# Patient Record
Sex: Female | Born: 1995 | Race: White | Hispanic: No | Marital: Single | Smoking: Never smoker
Health system: Southern US, Community
[De-identification: ages and names within clinical notes are randomized; demographics above are authoritative.]

## PROBLEM LIST (undated history)

## (undated) HISTORY — PX: OTHER SURGICAL HISTORY: SHX169

---
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
## 2016-02-05 ENCOUNTER — Emergency Department: Payer: BLUE CROSS/BLUE SHIELD

## 2016-02-05 ENCOUNTER — Encounter: Payer: Self-pay | Admitting: Emergency Medicine

## 2016-02-05 ENCOUNTER — Emergency Department
Admission: EM | Admit: 2016-02-05 | Discharge: 2016-02-05 | Disposition: A | Discharge status: Home / Self Care | Payer: BLUE CROSS/BLUE SHIELD | Attending: Emergency Medicine | Admitting: Emergency Medicine

## 2016-02-05 DIAGNOSIS — Y92328 Other athletic field as the place of occurrence of the external cause: Secondary | ICD-10-CM | POA: Insufficient documentation

## 2016-02-05 DIAGNOSIS — W228XXA Striking against or struck by other objects, initial encounter: Secondary | ICD-10-CM | POA: Insufficient documentation

## 2016-02-05 DIAGNOSIS — Y998 Other external cause status: Secondary | ICD-10-CM | POA: Insufficient documentation

## 2016-02-05 DIAGNOSIS — Y9363 Activity, rugby: Secondary | ICD-10-CM | POA: Insufficient documentation

## 2016-02-05 DIAGNOSIS — S060X0A Concussion without loss of consciousness, initial encounter: Secondary | ICD-10-CM | POA: Diagnosis not present

## 2016-02-05 DIAGNOSIS — S0990XA Unspecified injury of head, initial encounter: Secondary | ICD-10-CM | POA: Diagnosis present

## 2016-02-05 NOTE — Discharge Instructions (Signed)
Concussion, Adult  A concussion, or closed-head injury, is a brain injury caused by a direct blow to the head or by a quick and sudden movement (jolt) of the head or neck. Concussions are usually not life-threatening. Even so, the effects of a concussion can be serious. If you have had a concussion before, you are more likely to experience concussion-like symptoms after a direct blow to the head.   CAUSES  · Direct blow to the head, such as from running into another player during a soccer game, being hit in a fight, or hitting your head on a hard surface.  · A jolt of the head or neck that causes the brain to move back and forth inside the skull, such as in a car crash.  SIGNS AND SYMPTOMS  The signs of a concussion can be hard to notice. Early on, they may be missed by you, family members, and health care providers. You may look fine but act or feel differently.  Symptoms are usually temporary, but they may last for days, weeks, or even longer. Some symptoms may appear right away while others may not show up for hours or days. Every head injury is different. Symptoms include:  · Mild to moderate headaches that will not go away.  · A feeling of pressure inside your head.  · Having more trouble than usual:    Learning or remembering things you have heard.    Answering questions.    Paying attention or concentrating.    Organizing daily tasks.    Making decisions and solving problems.  · Slowness in thinking, acting or reacting, speaking, or reading.  · Getting lost or being easily confused.  · Feeling tired all the time or lacking energy (fatigued).  · Feeling drowsy.  · Sleep disturbances.    Sleeping more than usual.    Sleeping less than usual.    Trouble falling asleep.    Trouble sleeping (insomnia).  · Loss of balance or feeling lightheaded or dizzy.  · Nausea or vomiting.  · Numbness or tingling.  · Increased sensitivity to:    Sounds.    Lights.    Distractions.  · Vision problems or eyes that tire  easily.  · Diminished sense of taste or smell.  · Ringing in the ears.  · Mood changes such as feeling sad or anxious.  · Becoming easily irritated or angry for little or no reason.  · Lack of motivation.  · Seeing or hearing things other people do not see or hear (hallucinations).  DIAGNOSIS  Your health care provider can usually diagnose a concussion based on a description of your injury and symptoms. He or she will ask whether you passed out (lost consciousness) and whether you are having trouble remembering events that happened right before and during your injury.  Your evaluation might include:  · A brain scan to look for signs of injury to the brain. Even if the test shows no injury, you may still have a concussion.  · Blood tests to be sure other problems are not present.  TREATMENT  · Concussions are usually treated in an emergency department, in urgent care, or at a clinic. You may need to stay in the hospital overnight for further treatment.  · Tell your health care provider if you are taking any medicines, including prescription medicines, over-the-counter medicines, and natural remedies. Some medicines, such as blood thinners (anticoagulants) and aspirin, may increase the chance of complications. Also tell your health care   provider whether you have had alcohol or are taking illegal drugs. This information may affect treatment.  · Your health care provider will send you home with important instructions to follow.  · How fast you will recover from a concussion depends on many factors. These factors include how severe your concussion is, what part of your brain was injured, your age, and how healthy you were before the concussion.  · Most people with mild injuries recover fully. Recovery can take time. In general, recovery is slower in older persons. Also, persons who have had a concussion in the past or have other medical problems may find that it takes longer to recover from their current injury.  HOME  CARE INSTRUCTIONS  General Instructions  · Carefully follow the directions your health care provider gave you.  · Only take over-the-counter or prescription medicines for pain, discomfort, or fever as directed by your health care provider.  · Take only those medicines that your health care provider has approved.  · Do not drink alcohol until your health care provider says you are well enough to do so. Alcohol and certain other drugs may slow your recovery and can put you at risk of further injury.  · If it is harder than usual to remember things, write them down.  · If you are easily distracted, try to do one thing at a time. For example, do not try to watch TV while fixing dinner.  · Talk with family members or close friends when making important decisions.  · Keep all follow-up appointments. Repeated evaluation of your symptoms is recommended for your recovery.  · Watch your symptoms and tell others to do the same. Complications sometimes occur after a concussion. Older adults with a brain injury may have a higher risk of serious complications, such as a blood clot on the brain.  · Tell your teachers, school nurse, school counselor, coach, athletic trainer, or work manager about your injury, symptoms, and restrictions. Tell them about what you can or cannot do. They should watch for:    Increased problems with attention or concentration.    Increased difficulty remembering or learning new information.    Increased time needed to complete tasks or assignments.    Increased irritability or decreased ability to cope with stress.    Increased symptoms.  · Rest. Rest helps the brain to heal. Make sure you:    Get plenty of sleep at night. Avoid staying up late at night.    Keep the same bedtime hours on weekends and weekdays.    Rest during the day. Take daytime naps or rest breaks when you feel tired.  · Limit activities that require a lot of thought or concentration. These include:    Doing homework or job-related  work.    Watching TV.    Working on the computer.  · Avoid any situation where there is potential for another head injury (football, hockey, soccer, basketball, martial arts, downhill snow sports and horseback riding). Your condition will get worse every time you experience a concussion. You should avoid these activities until you are evaluated by the appropriate follow-up health care providers.  Returning To Your Regular Activities  You will need to return to your normal activities slowly, not all at once. You must give your body and brain enough time for recovery.  · Do not return to sports or other athletic activities until your health care provider tells you it is safe to do so.  · Ask   your health care provider when you can drive, ride a bicycle, or operate heavy machinery. Your ability to react may be slower after a brain injury. Never do these activities if you are dizzy.  · Ask your health care provider about when you can return to work or school.  Preventing Another Concussion  It is very important to avoid another brain injury, especially before you have recovered. In rare cases, another injury can lead to permanent brain damage, brain swelling, or death. The risk of this is greatest during the first 7-10 days after a head injury. Avoid injuries by:  · Wearing a seat belt when riding in a car.  · Drinking alcohol only in moderation.  · Wearing a helmet when biking, skiing, skateboarding, skating, or doing similar activities.  · Avoiding activities that could lead to a second concussion, such as contact or recreational sports, until your health care provider says it is okay.  · Taking safety measures in your home.    Remove clutter and tripping hazards from floors and stairways.    Use grab bars in bathrooms and handrails by stairs.    Place non-slip mats on floors and in bathtubs.    Improve lighting in dim areas.  SEEK MEDICAL CARE IF:  · You have increased problems paying attention or  concentrating.  · You have increased difficulty remembering or learning new information.  · You need more time to complete tasks or assignments than before.  · You have increased irritability or decreased ability to cope with stress.  · You have more symptoms than before.  Seek medical care if you have any of the following symptoms for more than 2 weeks after your injury:  · Lasting (chronic) headaches.  · Dizziness or balance problems.  · Nausea.  · Vision problems.  · Increased sensitivity to noise or light.  · Depression or mood swings.  · Anxiety or irritability.  · Memory problems.  · Difficulty concentrating or paying attention.  · Sleep problems.  · Feeling tired all the time.  SEEK IMMEDIATE MEDICAL CARE IF:  · You have severe or worsening headaches. These may be a sign of a blood clot in the brain.  · You have weakness (even if only in one hand, leg, or part of the face).  · You have numbness.  · You have decreased coordination.  · You vomit repeatedly.  · You have increased sleepiness.  · One pupil is larger than the other.  · You have convulsions.  · You have slurred speech.  · You have increased confusion. This may be a sign of a blood clot in the brain.  · You have increased restlessness, agitation, or irritability.  · You are unable to recognize people or places.  · You have neck pain.  · It is difficult to wake you up.  · You have unusual behavior changes.  · You lose consciousness.  MAKE SURE YOU:  · Understand these instructions.  · Will watch your condition.  · Will get help right away if you are not doing well or get worse.     This information is not intended to replace advice given to you by your health care provider. Make sure you discuss any questions you have with your health care provider.     Document Released: 02/24/2004 Document Revised: 12/25/2014 Document Reviewed: 06/26/2013  Elsevier Interactive Patient Education ©2016 [employer]

## 2016-02-05 NOTE — ED Notes (Signed)
Playing rugby, hit head on ground approx 5 pm, appeared to have LOC due to coach not seeing her move as coming up to her, eyes closed and c/o tingling hands and feet. Denies tingling hands and feet now however has beliateral temple pain and photophobia and states "feel like I am looking down on myself from above". MAE equally, conversation clear and coherent.

## 2016-02-05 NOTE — ED Notes (Signed)
C/o on and off nausea, headache 3/10.

## 2016-02-05 NOTE — ED Provider Notes (Signed)
[facility] Emergency Department Provider Note  ____________________________________________    I have reviewed the triage vital signs and the nursing notes.   HISTORY  Chief Complaint Head Injury    HPI Diane Gould is a 20 y.o. female who presents after a head injury. Patient reports she was playing rugby and hit the right side of her head on the ground trying to make a tackle. She remembers the event but does not remember how she got the field. She was having nausea after the incident. She denies pain. She does "feel like she is looking down herself from above". She reports she is feeling better in the emergency department than immediately after the incident     History reviewed. No pertinent past medical history.  There are no active problems to display for this patient.   Past Surgical History  Procedure Laterality Date  . Scapula repair      No current outpatient prescriptions on file.  Allergies Review of patient's allergies indicates no known allergies.  No family history on file.  Social History Social History  Substance Use Topics  . Smoking status: Never Smoker   . Smokeless tobacco: None  . Alcohol Use: Yes    Review of Systems  Constitutional: Positive for dizziness Eyes: Blurry vision after the event ENT: Negative for neck pain Cardiovascular: Negative for chest pain. Respiratory: Negative for shortness of breath. Gastrointestinal: Negative for abdominal pain Genitourinary: Negative for incontinence Musculoskeletal: Negative for back pain. No neck pain Skin: Negative for abrasion or injury Neurological: Negative for headaches or focal weakness Psychiatric: No anxiety    ____________________________________________   PHYSICAL EXAM:  VITAL SIGNS: ED Triage Vitals  Enc Vitals Group     BP 02/05/16 1854 107/61 mmHg     Pulse Rate 02/05/16 1854 83     Resp 02/05/16 1854 20     Temp 02/05/16 1854 97.5 F (36.4  C)     Temp Source 02/05/16 1854 Oral     SpO2 02/05/16 1854 100 %     Weight 02/05/16 1854 150 lb (68.04 kg)     Height 02/05/16 1854  (1.626 m)     Head Cir --      Peak Flow --      Pain Score 02/05/16 1856 4     Pain Loc --      Pain Edu? --      Excl. in GC? --      Constitutional: Alert and oriented. Well appearing and in no distress. Eyes: Conjunctivae are normal. PERRLA. EOMI ENT   Head: Normocephalic and atraumatic.   Mouth/Throat: Mucous membranes are moist. Cardiovascular: Normal rate, regular rhythm. Normal and symmetric distal pulses are present in all extremities Respiratory: Normal respiratory effort without tachypnea nor retractions. Gastrointestinal: Soft and non-tender in all quadrants. No distention. There is no CVA tenderness. Genitourinary: deferred Musculoskeletal: Nontender with normal range of motion in all extremities. No lower extremity tenderness nor edema. Neurologic:  Normal speech and language. No gross focal neurologic deficits are appreciated. Cranial nerves II through XII are normal Skin:  Skin is warm, dry and intact. No rash noted. Psychiatric: Mood and affect are normal. Patient exhibits appropriate insight and judgment.  ____________________________________________    LABS (pertinent positives/negatives)  Labs Reviewed - No data to display  ____________________________________________   EKG  None  ____________________________________________    RADIOLOGY I have personally reviewed any xrays that were ordered on this patient: CT head unremarkable  ____________________________________________  PROCEDURES  Procedure(s) performed: none  Critical Care performed: none  ____________________________________________   INITIAL IMPRESSION / ASSESSMENT AND PLAN / ED COURSE  Pertinent labs & imaging results that were available during my care of the patient were reviewed by me and considered in my medical decision  making (see chart for details).  Patient's history of present illness most consistent with concussion. Given that she was struck on the right temple area we will obtain CT head.  Discussed postconcussive syndrome with the patient. Confirmed that she will avoid sports until cleared by her physician. Return precautions discussed at length. Recommended cognitive rest  ____________________________________________   FINAL CLINICAL IMPRESSION(S) / ED DIAGNOSES  Final diagnoses:  Concussion, without loss of consciousness, initial encounter     Jene Every, MD 02/05/16 2355

## 2016-02-23 ENCOUNTER — Encounter: Payer: Self-pay | Admitting: Sports Medicine

## 2016-02-23 ENCOUNTER — Ambulatory Visit (INDEPENDENT_AMBULATORY_CARE_PROVIDER_SITE_OTHER): Payer: BLUE CROSS/BLUE SHIELD | Admitting: Sports Medicine

## 2016-02-23 VITALS — BP 108/67 | HR 70 | Ht 64.0 in | Wt 155.0 lb

## 2016-02-23 DIAGNOSIS — S060X9A Concussion with loss of consciousness of unspecified duration, initial encounter: Secondary | ICD-10-CM | POA: Diagnosis not present

## 2016-02-23 NOTE — Progress Notes (Signed)
   Subjective:    Patient ID: Diane Gould, female    DOB: 12-23-1995, 20 y.o.   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 Patient's head hit the ground. There is a questionable loss of consciousness. Patient had immediate sensation of tingling in all of her extremities as well as some photophobia and a headache. She was taken to the emergency room where a CT scan of her head was performed. It was unremarkable. She was diagnosed with a concussion and discharged. For the following week she was out of class and out of work. 6 days later her symptoms resolved. She has been asymptomatic since then. She has resumed both work and school without any problem. She has also started a return to play protocol under the direction of the athletic trainer at school. She has been able to progress without returning symptoms. She has yet to do any contact. This is her second concussion. Only prior concussion was 4-5 years ago while in high school. She had a repeat IMPACT test done at school which is not available for my review but the patient states that she was above baseline.  Past medical history reviewed   medications reviewed   allergies reviewed     Review of Systems     as above  Objective:   Physical Exam  Well-developed, well-nourished. No acute distress. Awake alert and oriented 3. Vital signs reviewed  Neurological exam: Cranial nerves II through XII are grossly intact. Excellent finger to nose. No symptoms with vertical or horizontal saccades. No nystagmus. No gross neurological deficit of either upper extremity. Patient has excellent balance with single leg, double leg, and tandem stances.     Assessment & Plan:   Resolved concussion  Patient will participate in a full contact practice tomorrow and, as long as  she is asymptomatic, I will clear her to play in the game on Saturday. She understands a second concussion will eliminate her for the rest of the season. Follow-up with me as needed.

## 2017-10-19 IMAGING — CT CT HEAD W/O CM
1 series · 16 of 30 positions shown, 20 images · non-contrast
Comparison: None.

CLINICAL DATA: 19-year-old female with trauma

EXAM:
CT HEAD WITHOUT CONTRAST
TECHNIQUE: Contiguous axial images were obtained from the base of the skull
through the vertex without intravenous contrast.

[Series 2: head wo · axial · 0.47mm/px · z∈[-163,-37]mm · 16 of 32 slices shown, 20 images]
[im 2/32  brain]
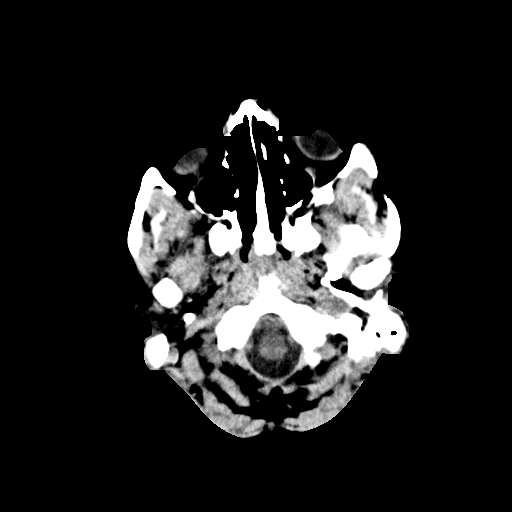
[im 2/32  bone]
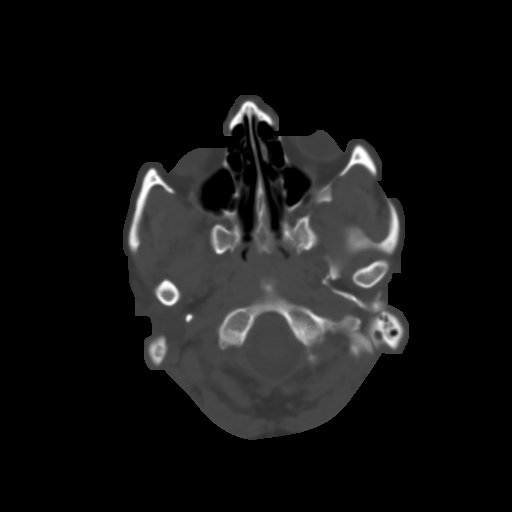
[im 4/32  brain]
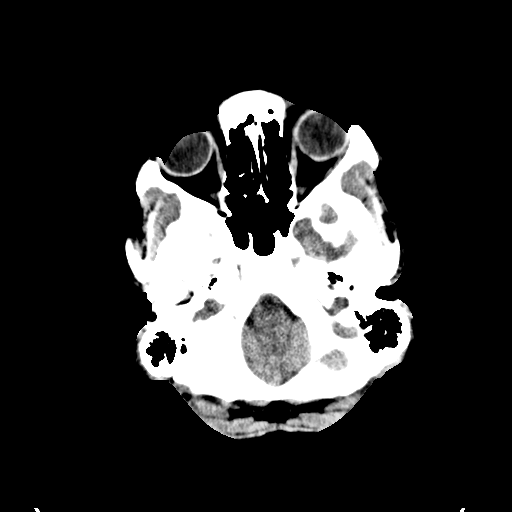
[im 6/32  brain]
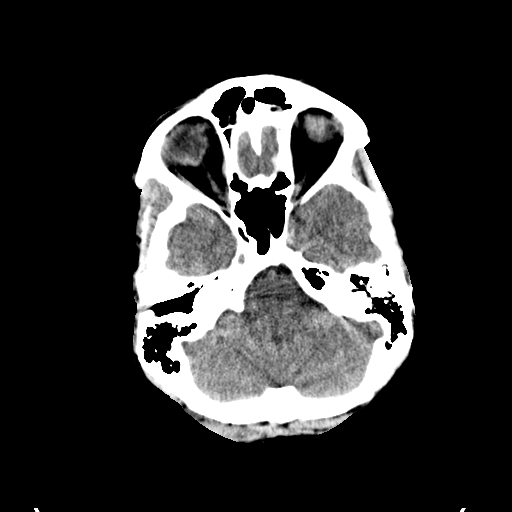
[im 8/32  brain]
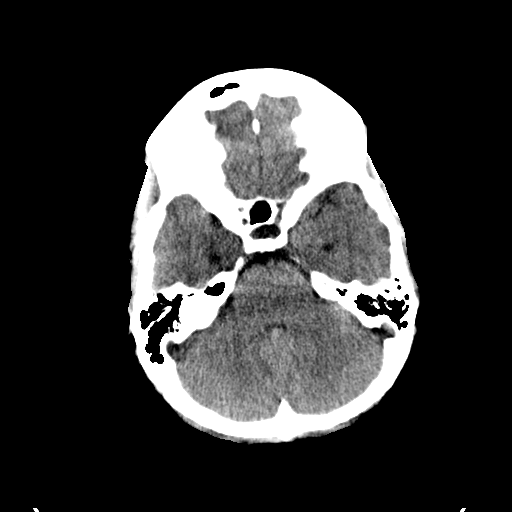
[im 9/32  brain]
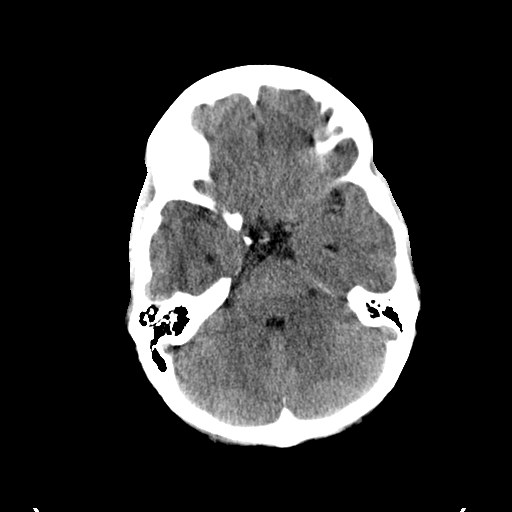
[im 9/32  bone]
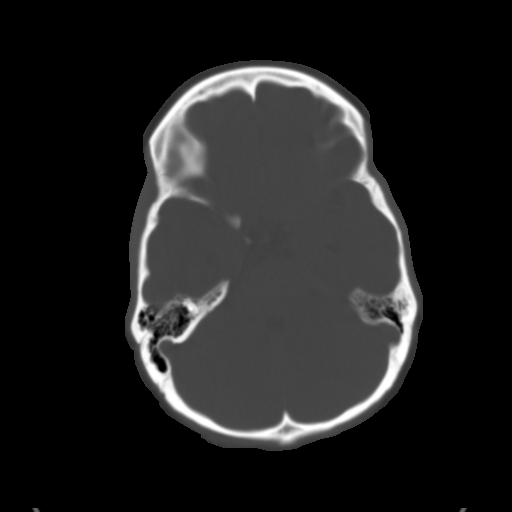
[im 11/32  brain]
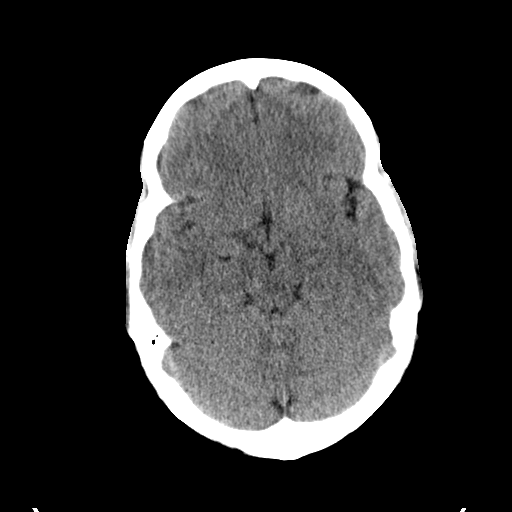
[im 13/32  brain]
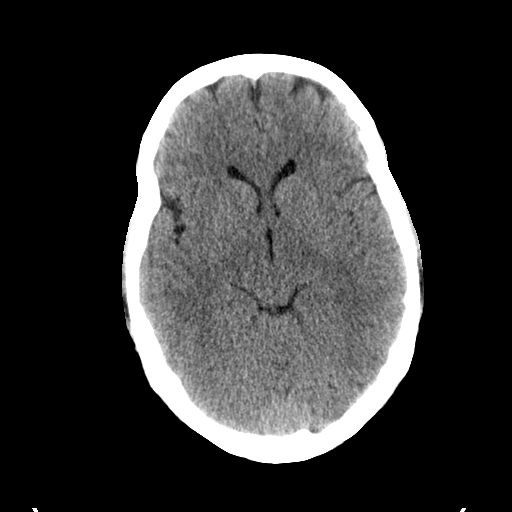
[im 15/32  brain]
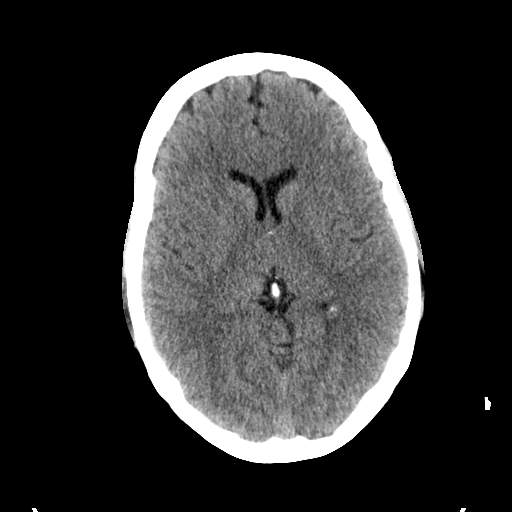
[im 17/32  brain]
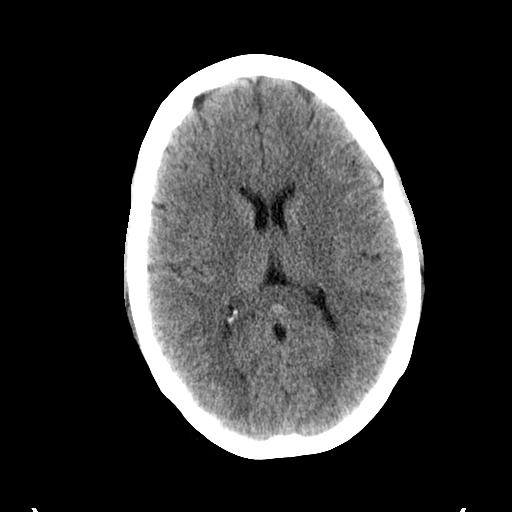
[im 17/32  bone]
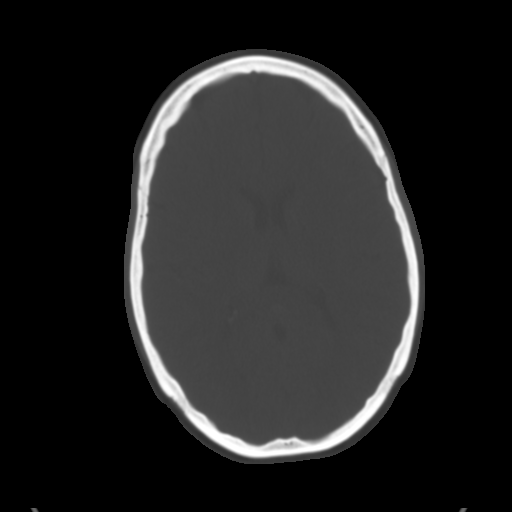
[im 19/32  brain]
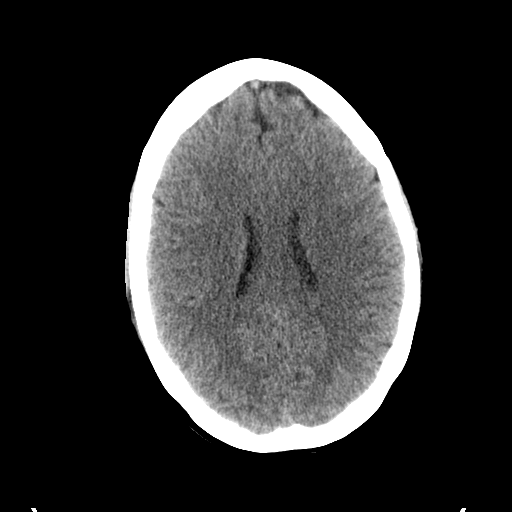
[im 21/32  brain]
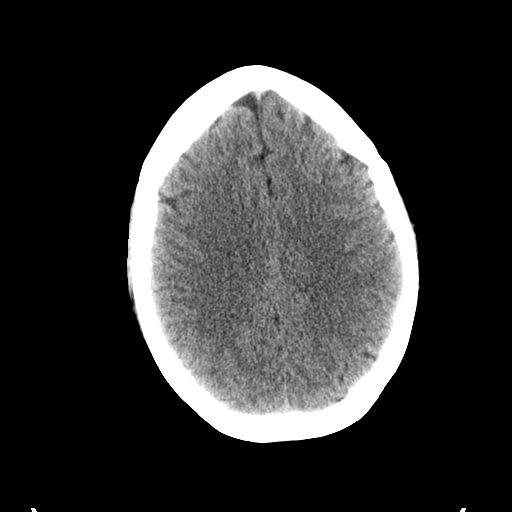
[im 23/32  brain]
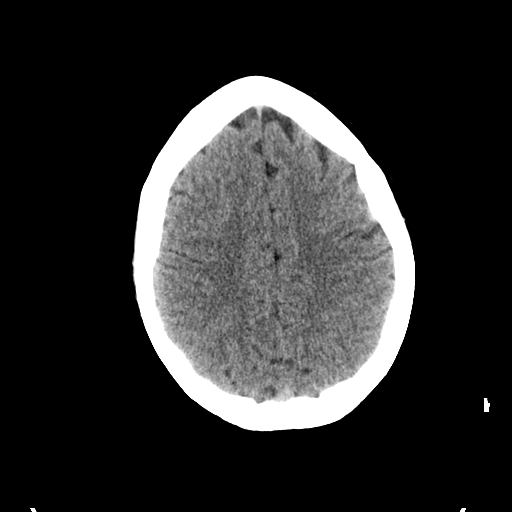
[im 24/32  brain]
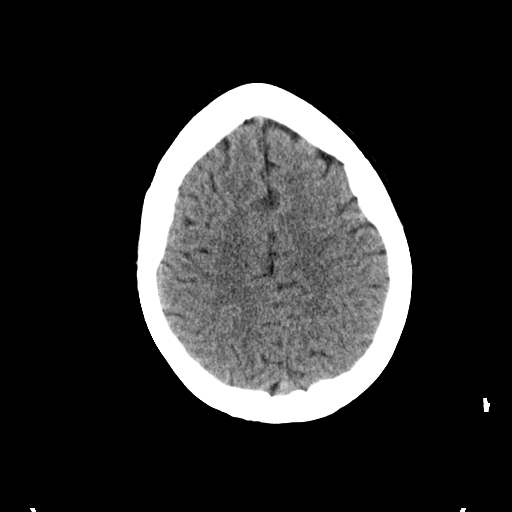
[im 24/32  bone]
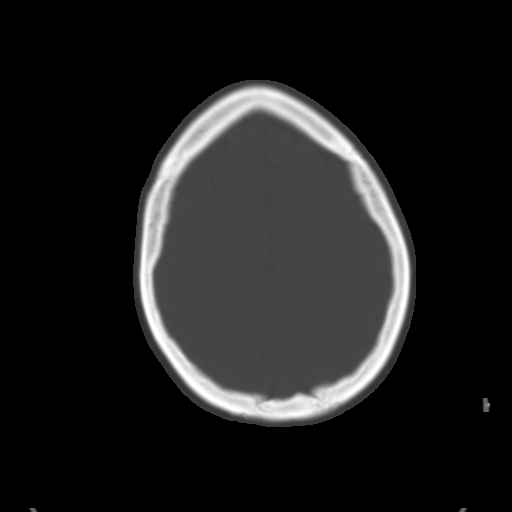
[im 26/32  brain]
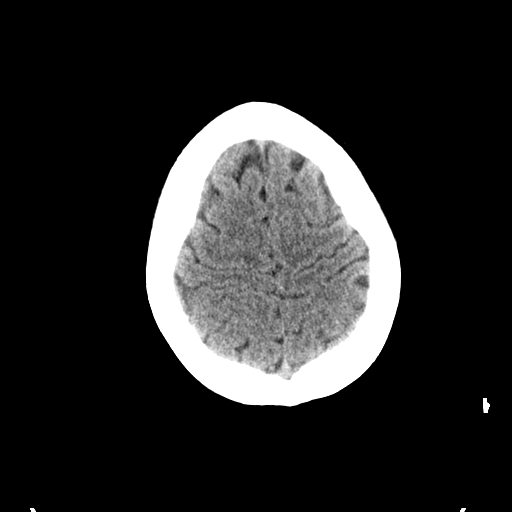
[im 28/32  brain]
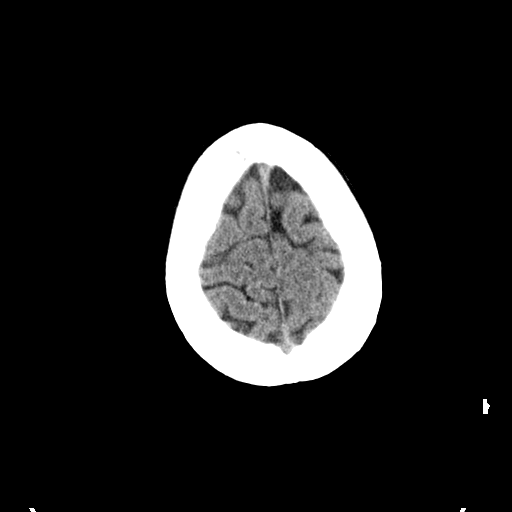
[im 30/32  brain]
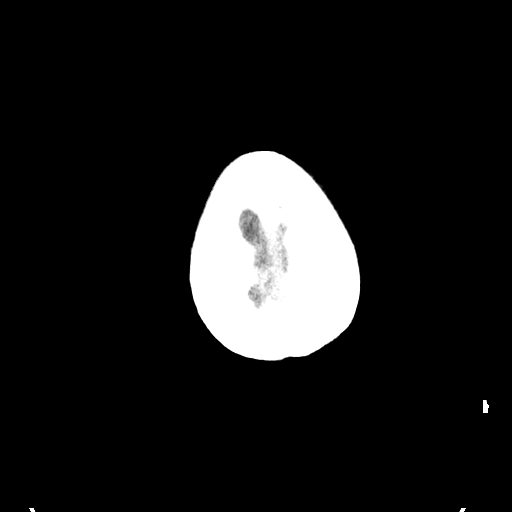

[16 of 30 positions shown; findings below may reference images not displayed]

FINDINGS: The ventricles and the sulci are appropriate in size for the
patient's age. There is no intracranial hemorrhage. No midline shift
or mass effect identified. The gray-white matter differentiation is
preserved.

The visualized paranasal sinuses and mastoid air cells are well
aerated. The calvarium is intact.
IMPRESSION: No acute intracranial pathology.
